# Patient Record
Sex: Male | Born: 1975 | Race: White | Hispanic: No | Marital: Single | Smoking: Former smoker
Health system: Southern US, Community
[De-identification: ages and names within clinical notes are randomized; demographics above are authoritative.]

## PROBLEM LIST (undated history)

## (undated) DIAGNOSIS — M5416 Radiculopathy, lumbar region: Secondary | ICD-10-CM

## (undated) DIAGNOSIS — R011 Cardiac murmur, unspecified: Secondary | ICD-10-CM

## (undated) DIAGNOSIS — M5136 Other intervertebral disc degeneration, lumbar region: Secondary | ICD-10-CM

## (undated) HISTORY — PX: OTHER SURGICAL HISTORY: SHX169

## (undated) HISTORY — PX: TONSILECTOMY/ADENOIDECTOMY WITH MYRINGOTOMY: SHX6125

## (undated) HISTORY — DX: Radiculopathy, lumbar region: M54.16

## (undated) HISTORY — DX: Cardiac murmur, unspecified: R01.1

## (undated) HISTORY — DX: Other intervertebral disc degeneration, lumbar region: M51.36

---

## 2003-07-29 ENCOUNTER — Encounter: Admission: RE | Admit: 2003-07-29 | Discharge: 2003-07-29 | Payer: Self-pay | Admitting: Orthopedic Surgery

## 2004-04-20 ENCOUNTER — Ambulatory Visit: Payer: Self-pay | Admitting: Otolaryngology

## 2006-04-29 ENCOUNTER — Emergency Department: Payer: Self-pay | Admitting: Emergency Medicine

## 2010-04-24 ENCOUNTER — Encounter: Payer: Self-pay | Admitting: Orthopedic Surgery

## 2014-04-08 ENCOUNTER — Ambulatory Visit: Payer: Self-pay | Admitting: Podiatry

## 2015-01-12 ENCOUNTER — Other Ambulatory Visit: Payer: Self-pay | Admitting: Neurology

## 2015-01-12 DIAGNOSIS — M79605 Pain in left leg: Principal | ICD-10-CM

## 2015-01-12 DIAGNOSIS — M79604 Pain in right leg: Secondary | ICD-10-CM

## 2015-01-20 ENCOUNTER — Ambulatory Visit
Admission: RE | Admit: 2015-01-20 | Discharge: 2015-01-20 | Disposition: A | Discharge status: Home / Self Care | Payer: BC Managed Care – PPO | Source: Ambulatory Visit | Attending: Neurology | Admitting: Neurology

## 2015-01-20 ENCOUNTER — Other Ambulatory Visit: Payer: Self-pay | Admitting: Internal Medicine

## 2015-01-20 DIAGNOSIS — M5126 Other intervertebral disc displacement, lumbar region: Secondary | ICD-10-CM | POA: Diagnosis not present

## 2015-01-20 DIAGNOSIS — M79604 Pain in right leg: Secondary | ICD-10-CM

## 2015-01-20 DIAGNOSIS — R222 Localized swelling, mass and lump, trunk: Principal | ICD-10-CM

## 2015-01-20 DIAGNOSIS — M489 Spondylopathy, unspecified: Secondary | ICD-10-CM

## 2015-01-20 DIAGNOSIS — M79605 Pain in left leg: Secondary | ICD-10-CM

## 2015-01-22 ENCOUNTER — Ambulatory Visit: Payer: BC Managed Care – PPO

## 2015-01-30 ENCOUNTER — Ambulatory Visit
Admission: RE | Admit: 2015-01-30 | Discharge: 2015-01-30 | Disposition: A | Discharge status: Home / Self Care | Payer: BC Managed Care – PPO | Source: Ambulatory Visit | Attending: Internal Medicine | Admitting: Internal Medicine

## 2015-01-30 DIAGNOSIS — M5124 Other intervertebral disc displacement, thoracic region: Secondary | ICD-10-CM | POA: Insufficient documentation

## 2015-01-30 DIAGNOSIS — R222 Localized swelling, mass and lump, trunk: Secondary | ICD-10-CM | POA: Diagnosis present

## 2015-01-30 DIAGNOSIS — M489 Spondylopathy, unspecified: Secondary | ICD-10-CM

## 2015-01-30 MED ORDER — GADOBENATE DIMEGLUMINE 529 MG/ML IV SOLN: 20.0000 mL | Freq: Once | INTRAVENOUS | Status: DC | PRN

## 2015-07-15 ENCOUNTER — Encounter: Payer: Self-pay | Admitting: Pain Medicine

## 2015-07-15 ENCOUNTER — Ambulatory Visit: Payer: BC Managed Care – PPO | Attending: Pain Medicine | Admitting: Pain Medicine

## 2015-07-15 VITALS — BP 121/84 | HR 80 | Temp 97.3°F | Resp 16 | Ht 68.0 in | Wt 220.0 lb

## 2015-07-15 DIAGNOSIS — G5702 Lesion of sciatic nerve, left lower limb: Secondary | ICD-10-CM

## 2015-07-15 DIAGNOSIS — M533 Sacrococcygeal disorders, not elsewhere classified: Secondary | ICD-10-CM | POA: Diagnosis not present

## 2015-07-15 DIAGNOSIS — M79604 Pain in right leg: Secondary | ICD-10-CM | POA: Diagnosis present

## 2015-07-15 DIAGNOSIS — M5116 Intervertebral disc disorders with radiculopathy, lumbar region: Secondary | ICD-10-CM | POA: Diagnosis not present

## 2015-07-15 DIAGNOSIS — M47814 Spondylosis without myelopathy or radiculopathy, thoracic region: Secondary | ICD-10-CM

## 2015-07-15 DIAGNOSIS — M5126 Other intervertebral disc displacement, lumbar region: Secondary | ICD-10-CM | POA: Diagnosis not present

## 2015-07-15 DIAGNOSIS — M4806 Spinal stenosis, lumbar region: Secondary | ICD-10-CM | POA: Insufficient documentation

## 2015-07-15 DIAGNOSIS — M47894 Other spondylosis, thoracic region: Secondary | ICD-10-CM

## 2015-07-15 DIAGNOSIS — M5416 Radiculopathy, lumbar region: Secondary | ICD-10-CM

## 2015-07-15 DIAGNOSIS — M47816 Spondylosis without myelopathy or radiculopathy, lumbar region: Secondary | ICD-10-CM

## 2015-07-15 DIAGNOSIS — M51369 Other intervertebral disc degeneration, lumbar region without mention of lumbar back pain or lower extremity pain: Secondary | ICD-10-CM

## 2015-07-15 DIAGNOSIS — M5136 Other intervertebral disc degeneration, lumbar region: Secondary | ICD-10-CM

## 2015-07-15 DIAGNOSIS — G5701 Lesion of sciatic nerve, right lower limb: Secondary | ICD-10-CM

## 2015-07-15 DIAGNOSIS — M545 Low back pain: Secondary | ICD-10-CM | POA: Diagnosis present

## 2015-07-15 DIAGNOSIS — G5700 Lesion of sciatic nerve, unspecified lower limb: Secondary | ICD-10-CM | POA: Insufficient documentation

## 2015-07-15 DIAGNOSIS — M79605 Pain in left leg: Secondary | ICD-10-CM | POA: Diagnosis present

## 2015-07-15 HISTORY — DX: Other intervertebral disc degeneration, lumbar region: M51.36

## 2015-07-15 HISTORY — DX: Radiculopathy, lumbar region: M54.16

## 2015-07-15 HISTORY — DX: Other intervertebral disc degeneration, lumbar region without mention of lumbar back pain or lower extremity pain: M51.369

## 2015-07-15 NOTE — Progress Notes (Signed)
Subjective:    Patient ID: Marvin Le, male    DOB: May 17, 1975, 40 y.o.   MRN: [id number]  HPI  The patient is a 40 year old gentleman who comes to pain management at the request of Dr. Emily Filbert for further evaluation and treatment of pain involving the lower back and lower extremity regions predominantly. The patient has long history of pain since childhood with pain occurring in the feet the patient eventually underwent surgery of the lower extremities with surgery of the gastrocnemius muscles with listening and continues to have pain despite surgery. The patient has also undergone Botox injections of the lower extremities and has undergone intraspinal injection without significant relief of pain. The patient states that his pain is aching burning cramping distressing dreadful dull feeling of constriction nagging tender patient states that the pain is aggravated by sitting standing and decreases with medications we discussed patient's condition and informed patient that we may consider referring patient to a tertiary center such as [facility], [facility] or [facility] for further evaluation and treatment after evaluation of patient and discussion of patient's condition and will inform patient that we could consider injection in the gluteal and piriformis musculature regions since patient appeared to be with what may be component of piriformis syndrome. The patient also mentioned piriformis syndrome today as stated that he had been doing some research on the symptoms and felt that piriformis syndrome could be contributing to his symptoms we informed patient that we will proceed with interventional treatment time return appointment consisting of cluneal and sciatic nerve blocks in attempt to decrease severity of symptoms, minimize progression of symptoms, and avoid the need for more involved treatment. The patient was with understanding and in agreement with suggested  treatment plan. We did not promise that we would prescribe any medications for the patient for treatment of his condition at any time       Review of Systems    Cardiovascular: Unremarkable   Pulmonary: Unremarkable  Neurological: Unremarkable  Psychological: Unremarkable  Gastrointestinal: Unremarkable  Genitourinary: Unremarkable  Hematologic: Unremarkable  Endocrine: Unremarkable  Rheumatological: Unremarkable  Musculoskeletal: Unremarkable  Other significant: Unremarkable      Objective:   Physical Exam   There was tenderness to palpation of paraspinal muscular treat and cervical region cervical facet region of mild degree with mild tenderness over the splenius capitis and occipitalis musculature regions. There was mild tenderness over the thoracic facet thoracic paraspinal musculature region. No crepitus of the thoracic region was noted Palpation of the acromioclavicular and glenohumeral joint regions reproduces minimal discomfort. The patient was with bilaterally equal grip strength and Tinel and Phalen's maneuver were without increase of pain of significant degree. There was tenderness over the lumbar paraspinal lumbar facet region a mild degree with mild tenderness with lateral bending rotation extension and palpation of the lumbar facets. Palpation of the PSIS and PII S regions were without increased pain of significant degree. Straight leg raising was tolerates approximately 30 with questionable increase of pain with dorsiflexion noted. EHL strength was questionably decreased. No definite sensory deficit or dermatomal distribution detected. No excessive tends to palpation of the knees were noted No allodynia of the lower extremities noted there was minimal tenderness of the greater trochanteric region iliotibial band region. Patient with moderate difficulty attending to stand on tiptoes and heels there was negative Homans.     Assessment & Plan:    Piriformis syndrome  Degenerative disc disease lumbar spine  L1-2  minimal disc bulging without stenosis L2-3 minimal bulging and congenitally short pedicles resulting in minimal bilateral neural foraminal stenosis L3-4 mild disc bulging congenitally steroid pedicles mild facet hypertrophy resulting in mild to moderate left greater than right neuroforaminal stenosis No significant spinal stenosis L4-L5 mild disc bulging congenitally short pedicles and mild facet hypertrophy resulting in mild left lateral recess stenosis and mild right and mild to moderate left neuroforaminal stenosis With no significant spinal stenosis L5-S1 negative  Lumbar facet syndrome  Lumbar radiculopathy  Sacroiliac joint dysfunction      PLAN  Continue present medications  Cluneal and sciatic nerve blocks to be performed at time of return appointment  F/U PCP Dr. Loleta Chance for evaliation of  BP and general medical  condition  F/U surgical evaluation. May consider pending follow-up evaluations  F/U neurological evaluation. May consider pending follow-up evaluations  May consider radiofrequency rhizolysis or intraspinal procedures pending response to present treatment and F/U evaluation   Patient to call [facility] should patient have concerns prior to scheduled return appointment.

## 2015-07-15 NOTE — Patient Instructions (Addendum)
Continue present medications  Cluneal and sciatic nerve blocks to be performed at time of return appointment  F/U PCP Dr. Loleta Chance for evaliation of  BP and general medical  condition  F/U surgical evaluation. May consider pending follow-up evaluations  F/U neurological evaluation. May consider pending follow-up evaluations  May consider radiofrequency rhizolysis or intraspinal procedures pending response to present treatment and F/U evaluation   Patient to call [facility] should patient have concerns prior to scheduled return appointment. Trigger Point Injection Trigger points are areas where you have muscle pain. A trigger point injection is a shot given in the trigger point to relieve that pain. A trigger point might feel like a knot in your muscle. It hurts to press on a trigger point. Sometimes the pain spreads out (radiates) to other parts of the body. For example, pressing on a trigger point in your shoulder might cause pain in your arm or neck. You might have one trigger point. Or, you might have more than one. People often have trigger points in their upper back and lower back. They also occur often in the neck and shoulders. Pain from a trigger point lasts for a long time. It can make it hard to keep moving. You might not be able to do the exercise or physical therapy that could help you deal with the pain. A trigger point injection may help. It does not work for everyone. But, it may relieve your pain for a few days or a few months. A trigger point injection does not cure long-lasting (chronic) pain. LET YOUR CAREGIVER KNOW ABOUT:  Any allergies (especially to latex, lidocaine, or steroids).  Blood-thinning medicines that you take. These drugs can lead to bleeding or bruising after an injection. They include:  Aspirin.  Ibuprofen.  Clopidogrel.  Warfarin.  Other medicines you take. This includes all vitamins, herbs, eyedrops, over-the-counter medicines, and  creams.  Use of steroids.  Recent infections.  Past problems with numbing medicines.  Bleeding problems.  Surgeries you have had.  Other health problems. RISKS AND COMPLICATIONS A trigger point injection is a safe treatment. However, problems may develop, such as:  Minor side effects usually go away in 1 to 2 days. These may include:  Soreness.  Bruising.  Stiffness.  More serious problems are rare. But, they may include:  Bleeding under the skin (hematoma).  Skin infection.  Breaking off of the needle under your skin.  Lung puncture.  The trigger point injection may not work for you. BEFORE THE PROCEDURE You may need to stop taking any medicine that thins your blood. This is to prevent bleeding and bruising. Usually these medicines are stopped several days before the injection. No other preparation is needed. PROCEDURE  A trigger point injection can be given in your caregiver's office or in a clinic. Each injection takes 2 minutes or less.  Your caregiver will feel for trigger points. The caregiver may use a marker to circle the area for the injection.  The skin over the trigger point will be washed with a germ-killing (antiseptic) solution.  The caregiver pinches the spot for the injection.  Then, a very thin needle is used for the shot. You may feel pain or a twitching feeling when the needle enters the trigger point.  A numbing solution may be injected into the trigger point. Sometimes a drug to keep down swelling, redness, and warmth (inflammation) is also injected.  Your caregiver moves the needle around the trigger zone until the tightness  and twitching goes away.  After the injection, your caregiver may put gentle pressure over the injection site.  Then it is covered with a bandage. AFTER THE PROCEDURE  You can go right home after the injection.  The bandage can be taken off after a few hours.  You may feel sore and stiff for 1 to 2 days.  Go  back to your regular activities slowly. Your caregiver may ask you to stretch your muscles. Do not do anything that takes extra energy for a few days.  Follow your caregiver's instructions to manage and treat other pain.   This information is not intended to replace advice given to you by your health care provider. Make sure you discuss any questions you have with your health care provider.   Document Released: 03/09/2011 Document Revised: 07/15/2012 Document Reviewed: 03/09/2011 Elsevier Interactive Patient Education [employer].

## 2015-07-15 NOTE — Progress Notes (Signed)
Safety precautions to be maintained throughout the outpatient stay will include: orient to surroundings, keep bed in low position, maintain call bell within reach at all times, provide assistance with transfer out of bed and ambulation.  

## 2015-07-21 LAB — TOXASSURE SELECT 13 (MW), URINE: PDF: 0

## 2015-08-11 ENCOUNTER — Encounter: Payer: Self-pay | Admitting: Pain Medicine

## 2015-08-11 ENCOUNTER — Ambulatory Visit: Payer: BC Managed Care – PPO | Attending: Pain Medicine | Admitting: Pain Medicine

## 2015-08-11 VITALS — BP 106/67 | HR 65 | Temp 98.7°F | Resp 160 | Ht 69.0 in | Wt 220.0 lb

## 2015-08-11 DIAGNOSIS — M5126 Other intervertebral disc displacement, lumbar region: Secondary | ICD-10-CM | POA: Insufficient documentation

## 2015-08-11 DIAGNOSIS — M791 Myalgia: Secondary | ICD-10-CM | POA: Diagnosis present

## 2015-08-11 DIAGNOSIS — M47894 Other spondylosis, thoracic region: Secondary | ICD-10-CM

## 2015-08-11 DIAGNOSIS — M545 Low back pain: Secondary | ICD-10-CM | POA: Diagnosis present

## 2015-08-11 DIAGNOSIS — G5701 Lesion of sciatic nerve, right lower limb: Secondary | ICD-10-CM

## 2015-08-11 DIAGNOSIS — M5416 Radiculopathy, lumbar region: Secondary | ICD-10-CM

## 2015-08-11 DIAGNOSIS — M4806 Spinal stenosis, lumbar region: Secondary | ICD-10-CM | POA: Insufficient documentation

## 2015-08-11 DIAGNOSIS — M5136 Other intervertebral disc degeneration, lumbar region: Secondary | ICD-10-CM | POA: Insufficient documentation

## 2015-08-11 DIAGNOSIS — G5702 Lesion of sciatic nerve, left lower limb: Secondary | ICD-10-CM

## 2015-08-11 DIAGNOSIS — M533 Sacrococcygeal disorders, not elsewhere classified: Secondary | ICD-10-CM

## 2015-08-11 DIAGNOSIS — M79606 Pain in leg, unspecified: Secondary | ICD-10-CM | POA: Diagnosis present

## 2015-08-11 DIAGNOSIS — M47816 Spondylosis without myelopathy or radiculopathy, lumbar region: Secondary | ICD-10-CM

## 2015-08-11 DIAGNOSIS — M47814 Spondylosis without myelopathy or radiculopathy, thoracic region: Secondary | ICD-10-CM

## 2015-08-11 MED ORDER — BUPIVACAINE HCL (PF) 0.25 % IJ SOLN: 30.0000 mL | Freq: Once | INTRAMUSCULAR | Status: AC

## 2015-08-11 MED ORDER — ORPHENADRINE CITRATE 30 MG/ML IJ SOLN
INTRAMUSCULAR | Status: AC
  Filled 2015-08-11: qty 2

## 2015-08-11 MED ORDER — TRIAMCINOLONE ACETONIDE 40 MG/ML IJ SUSP
INTRAMUSCULAR | Status: AC
  Filled 2015-08-11: qty 1

## 2015-08-11 MED ORDER — BUPIVACAINE HCL (PF) 0.25 % IJ SOLN
INTRAMUSCULAR | Status: AC
  Filled 2015-08-11: qty 30

## 2015-08-11 MED ORDER — ORPHENADRINE CITRATE 30 MG/ML IJ SOLN: 60.0000 mg | Freq: Once | INTRAMUSCULAR | Status: AC

## 2015-08-11 MED ORDER — TRIAMCINOLONE ACETONIDE 40 MG/ML IJ SUSP: 40.0000 mg | Freq: Once | INTRAMUSCULAR | Status: AC

## 2015-08-11 NOTE — Progress Notes (Signed)
Subjective:    Patient ID: Marvin Le, male    DOB: 09/20/1975, 40 y.o.   MRN: [id number]  HPI  PROCEDURE PERFORMED: Cluneal sciatic nerve block   NOTE: The patient is a 40 y.o. male who [facility] to [facility] for further evaluation and treatment of pain involving the lumbar and lower extremity region with pain occurring in the region of the buttocks and lower extremity of significant degree. Prior studies revealed the patient to be with MRI findings of degenerative disc disease lumbar spine  L1-2 minimal disc bulging without stenosis L2-3 minimal bulging and congenitally short pedicles resulting in minimal bilateral neural foraminal stenosis L3-4 mild disc bulging congenitally steroid pedicles mild facet hypertrophy resulting in mild to moderate left greater than right neuroforaminal stenosis No significant spinal stenosis L4-L5 mild disc bulging congenitally short pedicles and mild facet hypertrophy resulting in mild left lateral recess stenosis and mild right and mild to moderate left neuroforaminal stenosis With no significant spinal stenosis area the patient is with reproduction of symptoms with palpation in the region of the gluteal musculature region and piriformis musculature region. L5-S1 was without significant abnormalities There is concern regarding component of pain due to piriformis syndrome. We have also discussed patient's pain originating from intraspinal abnormalities as well as entrapment due to muscle as well as other condition. The risks, benefits, and expectations of the procedure have been discussed and explained to the patient who was understanding and in agreement with suggested treatment plan. We will proceed with interventional treatment as discussed and as explained to the patient who wishes to proceed with proposed treatment.   PROCEDURE #1: Left cluneal nerve block with EKG, blood pressure, pulse, and pulse oximetry monitoring. The procedure was  performed with the patient in the lateral decubitus position. Following alcohol prep of proposed entry site and identification of landmarks, a 22 -gauge needle was inserted into the gluteal musculature region and following elicitation of paresthesias radiating to the buttocks, needle was slightly withdrawn and following negative aspiration, a total of 4 mL of 0.25% bupivacaine with Kenalog injected for left  cluneal nerve block. Needle removed. The patient tolerated the injection well.   PROCEDURE #2: Left sciatic nerve block with EKG, blood pressure, pulse, and pulse oximetry monitoring. The procedure was performed with the patient in the lateral decubitus position. Following identification of greater trochanter for establishing point of needle entry and alcohol prep of proposed entry site, a 22 -gauge needle was inserted and following elicitation of paresthesias radiating from buttocks to the foot, needle was slightly withdrawn. Following negative aspiration, a total of 4 mL of 0.25% bupivacaine with Kenalog injected for left sciatic nerve block. Needle removed. The patient tolerated injection well. A total of 10 mg of Kenalog was utilized for the procedure.   Myoneural block injections of the gluteal musculature region Following alcohol prep of proposed entry site a 22-gauge needle was inserted in the gluteal musculature region and following negative aspiration 2 cc of 0.25% bupivacaine with Norflex was injected for myoneural block injection of the gluteal musculature region  The patient tolerated procedure well  A total of 10 mg of Kenalog was utilized for the procedure  PLAN:   1. Medications: Will continue presently prescribed medications. 2. Will consider modification of treatment regimen pending response to treatment rendered on today's visit and follow-up evaluation. 3. The patient is to follow-up with primary care physician Dr. Emily Filbert regarding blood pressure and general medical  condition status pose procedure performed  on today's visit. 4. Surgical evaluation as discussed. We may consider pending follow-up evaluation 5. Neurological evaluation as discussed. We may consider additional studies for neurological assessment of patient's condition 6. The patient may be a candidate for radiofrequency procedures, Botox injections, implantation type procedures, and other treatment pending response to treatment rendered on today's visit and follow-up evaluation. 7. The patient has been advised to adhere to proper body mechanics and avoid activities which appear to aggravate condition. 8. The patient has been advised to call the [facility] prior to scheduled return appointment should there be significant change in condition or should patient have other concerns regarding condition prior to scheduled return appointment.  The patient is understanding and in agreement with suggested treatment plan.    Review of Systems     Objective:   Physical Exam        Assessment & Plan:

## 2015-08-11 NOTE — Progress Notes (Signed)
Patient here for procedure d/t bilateral buttocks pain and bilateral foot pain.

## 2015-08-11 NOTE — Patient Instructions (Addendum)
Continue present medication  F/U PCP Dr. Loleta Chance for evaliation of  BP and general medical  condition  F/U surgical evaluation. May consider pending follow-up evaluations  F/U neurological evaluation. May consider pending follow-up evaluations  May consider radiofrequency rhizolysis or intraspinal procedures pending response to present treatment and F/U evaluation   Patient to call [facility] should patient have concerns prior to scheduled return appointment. Sciatica Sciatica is pain, weakness, numbness, or tingling along the path of the sciatic nerve. The nerve starts in the lower back and runs down the back of each leg. The nerve controls the muscles in the lower leg and in the back of the knee, while also providing sensation to the back of the thigh, lower leg, and the sole of your foot. Sciatica is a symptom of another medical condition. For instance, nerve damage or certain conditions, such as a herniated disk or bone spur on the spine, pinch or put pressure on the sciatic nerve. This causes the pain, weakness, or other sensations normally associated with sciatica. Generally, sciatica only affects one side of the body. CAUSES   Herniated or slipped disc.  Degenerative disk disease.  A pain disorder involving the narrow muscle in the buttocks (piriformis syndrome).  Pelvic injury or fracture.  Pregnancy.  Tumor (rare). SYMPTOMS  Symptoms can vary from mild to very severe. The symptoms usually travel from the low back to the buttocks and down the back of the leg. Symptoms can include:  Mild tingling or dull aches in the lower back, leg, or hip.  Numbness in the back of the calf or sole of the foot.  Burning sensations in the lower back, leg, or hip.  Sharp pains in the lower back, leg, or hip.  Leg weakness.  Severe back pain inhibiting movement. These symptoms may get worse with coughing, sneezing, laughing, or prolonged sitting or standing. Also, being  overweight may worsen symptoms. DIAGNOSIS  Your caregiver will perform a physical exam to look for common symptoms of sciatica. He or she may ask you to do certain movements or activities that would trigger sciatic nerve pain. Other tests may be performed to find the cause of the sciatica. These may include:  Blood tests.  X-rays.  Imaging tests, such as an MRI or CT scan. TREATMENT  Treatment is directed at the cause of the sciatic pain. Sometimes, treatment is not necessary and the pain and discomfort goes away on its own. If treatment is needed, your caregiver may suggest:  Over-the-counter medicines to relieve pain.  Prescription medicines, such as anti-inflammatory medicine, muscle relaxants, or narcotics.  Applying heat or ice to the painful area.  Steroid injections to lessen pain, irritation, and inflammation around the nerve.  Reducing activity during periods of pain.  Exercising and stretching to strengthen your abdomen and improve flexibility of your spine. Your caregiver may suggest losing weight if the extra weight makes the back pain worse.  Physical therapy.  Surgery to eliminate what is pressing or pinching the nerve, such as a bone spur or part of a herniated disk. HOME CARE INSTRUCTIONS   Only take over-the-counter or prescription medicines for pain or discomfort as directed by your caregiver.  Apply ice to the affected area for 20 minutes, 3-4 times a day for the first 48-72 hours. Then try heat in the same way.  Exercise, stretch, or perform your usual activities if these do not aggravate your pain.  Attend physical therapy sessions as directed by your caregiver.  Keep all follow-up appointments as directed by your caregiver.  Do not wear high heels or shoes that do not provide proper support.  Check your mattress to see if it is too soft. A firm mattress may lessen your pain and discomfort. SEEK IMMEDIATE MEDICAL CARE IF:   You lose control of your  bowel or bladder (incontinence).  You have increasing weakness in the lower back, pelvis, buttocks, or legs.  You have redness or swelling of your back.  You have a burning sensation when you urinate.  You have pain that gets worse when you lie down or awakens you at night.  Your pain is worse than you have experienced in the past.  Your pain is lasting longer than 4 weeks.  You are suddenly losing weight without reason. MAKE SURE YOU:  Understand these instructions.  Will watch your condition.  Will get help right away if you are not doing well or get worse.   This information is not intended to replace advice given to you by your health care provider. Make sure you discuss any questions you have with your health care provider.   Document Released: 03/14/2001 Document Revised: 12/09/2014 Document Reviewed: 07/30/2011 Elsevier Interactive Patient Education 2016 [employer] Pain Management Discharge Instructions  General Discharge Instructions :  If you need to reach your doctor call: Monday-Friday 8:00 am - 4:00 pm at [phone] or toll free [phone].  After clinic hours [phone] to have operator reach doctor.  Bring all of your medication bottles to all your appointments in the pain clinic.  To cancel or reschedule your appointment with Pain Management please remember to call 24 hours in advance to avoid a fee.  Refer to the educational materials which you have been given on: General Risks, I had my Procedure. Discharge Instructions, Post Sedation.  Post Procedure Instructions:  The drugs you were given will stay in your system until tomorrow, so for the next 24 hours you should not drive, make any legal decisions or drink any alcoholic beverages.  You may eat anything you prefer, but it is better to start with liquids then soups and crackers, and gradually work up to solid foods.  Please notify your doctor immediately if you have any unusual bleeding,  trouble breathing or pain that is not related to your normal pain.  Depending on the type of procedure that was done, some parts of your body may feel week and/or numb.  This usually clears up by tonight or the next day.  Walk with the use of an assistive device or accompanied by an adult for the 24 hours.  You may use ice on the affected area for the first 24 hours.  Put ice in a Ziploc bag and cover with a towel and place against area 15 minutes on 15 minutes off.  You may switch to heat after 24 hours.Trigger Point Injection Trigger points are areas where you have muscle pain. A trigger point injection is a shot given in the trigger point to relieve that pain. A trigger point might feel like a knot in your muscle. It hurts to press on a trigger point. Sometimes the pain spreads out (radiates) to other parts of the body. For example, pressing on a trigger point in your shoulder might cause pain in your arm or neck. You might have one trigger point. Or, you might have more than one. People often have trigger points in their upper back and lower back. They also occur often in the neck  and shoulders. Pain from a trigger point lasts for a long time. It can make it hard to keep moving. You might not be able to do the exercise or physical therapy that could help you deal with the pain. A trigger point injection may help. It does not work for everyone. But, it may relieve your pain for a few days or a few months. A trigger point injection does not cure long-lasting (chronic) pain. LET YOUR CAREGIVER KNOW ABOUT:  Any allergies (especially to latex, lidocaine, or steroids).  Blood-thinning medicines that you take. These drugs can lead to bleeding or bruising after an injection. They include:  Aspirin.  Ibuprofen.  Clopidogrel.  Warfarin.  Other medicines you take. This includes all vitamins, herbs, eyedrops, over-the-counter medicines, and creams.  Use of steroids.  Recent infections.  Past  problems with numbing medicines.  Bleeding problems.  Surgeries you have had.  Other health problems. RISKS AND COMPLICATIONS A trigger point injection is a safe treatment. However, problems may develop, such as:  Minor side effects usually go away in 1 to 2 days. These may include:  Soreness.  Bruising.  Stiffness.  More serious problems are rare. But, they may include:  Bleeding under the skin (hematoma).  Skin infection.  Breaking off of the needle under your skin.  Lung puncture.  The trigger point injection may not work for you. BEFORE THE PROCEDURE You may need to stop taking any medicine that thins your blood. This is to prevent bleeding and bruising. Usually these medicines are stopped several days before the injection. No other preparation is needed. PROCEDURE  A trigger point injection can be given in your caregiver's office or in a clinic. Each injection takes 2 minutes or less.  Your caregiver will feel for trigger points. The caregiver may use a marker to circle the area for the injection.  The skin over the trigger point will be washed with a germ-killing (antiseptic) solution.  The caregiver pinches the spot for the injection.  Then, a very thin needle is used for the shot. You may feel pain or a twitching feeling when the needle enters the trigger point.  A numbing solution may be injected into the trigger point. Sometimes a drug to keep down swelling, redness, and warmth (inflammation) is also injected.  Your caregiver moves the needle around the trigger zone until the tightness and twitching goes away.  After the injection, your caregiver may put gentle pressure over the injection site.  Then it is covered with a bandage. AFTER THE PROCEDURE  You can go right home after the injection.  The bandage can be taken off after a few hours.  You may feel sore and stiff for 1 to 2 days.  Go back to your regular activities slowly. Your caregiver may  ask you to stretch your muscles. Do not do anything that takes extra energy for a few days.  Follow your caregiver's instructions to manage and treat other pain.   This information is not intended to replace advice given to you by your health care provider. Make sure you discuss any questions you have with your health care provider.   Document Released: 03/09/2011 Document Revised: 07/15/2012 Document Reviewed: 03/09/2011 Elsevier Interactive Patient Education [employer].

## 2015-08-31 ENCOUNTER — Ambulatory Visit: Payer: BC Managed Care – PPO | Attending: Pain Medicine | Admitting: Pain Medicine

## 2015-08-31 ENCOUNTER — Encounter: Payer: Self-pay | Admitting: Pain Medicine

## 2015-08-31 VITALS — BP 113/79 | HR 75 | Temp 98.4°F | Resp 18 | Ht 69.0 in | Wt 220.0 lb

## 2015-08-31 DIAGNOSIS — M5126 Other intervertebral disc displacement, lumbar region: Secondary | ICD-10-CM | POA: Insufficient documentation

## 2015-08-31 DIAGNOSIS — M47816 Spondylosis without myelopathy or radiculopathy, lumbar region: Secondary | ICD-10-CM

## 2015-08-31 DIAGNOSIS — M79605 Pain in left leg: Secondary | ICD-10-CM | POA: Diagnosis present

## 2015-08-31 DIAGNOSIS — M4806 Spinal stenosis, lumbar region: Secondary | ICD-10-CM | POA: Insufficient documentation

## 2015-08-31 DIAGNOSIS — M47814 Spondylosis without myelopathy or radiculopathy, thoracic region: Secondary | ICD-10-CM

## 2015-08-31 DIAGNOSIS — M533 Sacrococcygeal disorders, not elsewhere classified: Secondary | ICD-10-CM | POA: Insufficient documentation

## 2015-08-31 DIAGNOSIS — M5116 Intervertebral disc disorders with radiculopathy, lumbar region: Secondary | ICD-10-CM | POA: Diagnosis not present

## 2015-08-31 DIAGNOSIS — G5702 Lesion of sciatic nerve, left lower limb: Secondary | ICD-10-CM

## 2015-08-31 DIAGNOSIS — M79604 Pain in right leg: Secondary | ICD-10-CM | POA: Diagnosis present

## 2015-08-31 DIAGNOSIS — M5416 Radiculopathy, lumbar region: Secondary | ICD-10-CM

## 2015-08-31 DIAGNOSIS — M5136 Other intervertebral disc degeneration, lumbar region: Secondary | ICD-10-CM

## 2015-08-31 DIAGNOSIS — M545 Low back pain: Secondary | ICD-10-CM | POA: Diagnosis present

## 2015-08-31 DIAGNOSIS — M47894 Other spondylosis, thoracic region: Secondary | ICD-10-CM

## 2015-08-31 DIAGNOSIS — G5701 Lesion of sciatic nerve, right lower limb: Secondary | ICD-10-CM

## 2015-08-31 NOTE — Progress Notes (Signed)
Safety precautions to be maintained throughout the outpatient stay will include: orient to surroundings, keep bed in low position, maintain call bell within reach at all times, provide assistance with transfer out of bed and ambulation.  

## 2015-08-31 NOTE — Patient Instructions (Addendum)
PLAN   Continue present medications Flexeril and tramadol  Lumbosacral selective nerve root block to be performed at time of return appointment  F/U PCP Dr. Loleta Chance for evaliation of  BP and general medical  condition  F/U surgical evaluation. May consider pending follow-up evaluations  F/U neurological evaluation. May consider additional studies pending follow-up evaluations  Ask the nurses and secretary the date of your appointment at [facility]  May consider radiofrequency rhizolysis or intraspinal procedures pending response to present treatment and F/U evaluation   Patient to call Pain Management Center should patient have concerns prior to scheduled return appointment. Selective Nerve Root Block Patient Information  Description: Specific nerve roots exit the spinal canal and these nerves can be compressed and inflamed by a bulging disc and bone spurs.  By injecting steroids on the nerve root, we can potentially decrease the inflammation surrounding these nerves, which often leads to decreased pain.  Also, by injecting local anesthesia on the nerve root, this can provide [place] helpful information to give to your referring doctor if it decreases your pain.  Selective nerve root blocks can be done along the spine from the neck to the low back depending on the location of your pain.   After numbing the skin with local anesthesia, a small needle is passed to the nerve root and the position of the needle is verified using x-ray pictures.  After the needle is in correct position, we then deposit the medication.  You may experience a pressure sensation while this is being done.  The entire block usually lasts less than 15 minutes.  Conditions that may be treated with selective nerve root blocks:  Low back and leg pain  Spinal stenosis  Diagnostic block prior to potential surgery  Neck and arm pain  Post laminectomy syndrome  Preparation for the injection:  1. Do not eat  any solid food or dairy products within 8 hours of your appointment. 2. You may drink clear liquids up to 3 hours before an appointment.  Clear liquids include water, black coffee, juice or soda.  No milk or cream please. 3. You may take your regular medications, including pain medications, with a sip of water before your appointment.  Diabetics should hold regular insulin (if taken separately) and take 1/2 normal NPH dose the morning of the procedure.  Carry some sugar containing items with you to your appointment. 4. A driver must accompany you and be prepared to drive you home after your procedure. 5. Bring all your current medications with you. 6. An IV may be inserted and sedation may be given at the discretion of the physician. 7. A blood pressure cuff, EKG, and other monitors will often be applied during the procedure.  Some patients may need to have extra oxygen administered for a short period. 8. You will be asked to provide medical information, including allergies, prior to the procedure.  We must know immediately if you are taking blood  Thinners (like Coumadin) or if you are allergic to IV iodine contrast (dye).  Possible side-effects: All are usually temporary  Bleeding from needle site  Light headedness  Numbness and tingling  Decreased blood pressure  Weakness in arms/legs  Pressure sensation in back/neck  Pain at injection site (several days)  Possible complications: All are extremely rare  Infection  Nerve injury  Spinal headache (a headache wore with upright position)  Call if you experience:  Fever/chills associated with headache or increased back/neck pain  Headache worsened  by an upright position  New onset weakness or numbness of an extremity below the injection site  Hives or difficulty breathing (go to the emergency room)  Inflammation or drainage at the injection site(s)  Severe back/neck pain greater than usual  New symptoms which are  concerning to you  Please note:  Although the local anesthetic injected can often make your back or neck feel good for several hours after the injection the pain will likely return.  It takes 3-5 days for steroids to work on the nerve root. You may not notice any pain relief for at least one week.  If effective, we will often do a series of 3 injections spaced 3-6 weeks apart to maximally decrease your pain.    If you have any questions, please call [phone] [facility]

## 2015-08-31 NOTE — Progress Notes (Signed)
Subjective:    Patient ID: Marvin Le, male    DOB: December 12, 1975, 40 y.o.   MRN: [id number]  HPI  The patient is a 40 year old gentleman who returns to pain management for further evaluation and treatment of pain involving the lower back and lower extremity regions. The patient has significant lower back and lower extremity pain and was with minimal relief of pain following cluneal and sciatic nerve blocks. We have been concern regarding piriformis syndrome contributing to patient's symptomatology. On today's visit we discussed patient's condition and will consider patient for lumbosacral selective nerve root block to be performed at time return appointment. The patient is status post Botox injections as well as prior spinal injections and is undergone surgery of the gastrocnemius muscles and tendons for treatment of pain involving the lower back lower extremity region. We discussed patient's condition on today's visit and patient will continue present medications consisting of tramadol and Flexeril. We will proceed with lumbosacral selective nerve root block at time of return appointment in attempt to decrease severity of symptoms, minimize progression of symptoms, and avoid the need for more involved treatment. We will also refer patient to [facility] for further evaluation and assessment of his condition as discussed. All agreed to suggested treatment plan     Review of Systems     Objective:   Physical Exam  There was tenderness to palpation of the paraspinal musculatures and the cervical region cervical facet region a mild degree with mild tenderness over the splenius capitis and occipitalis musculature regions. Palpation of the acromioclavicular and glenohumeral joint regions reproduce mild discomfort and patient appeared to be with unremarkable Spurling's maneuver. Tinel and Phalen's maneuver were without increased pain of significant degree and patient appeared to be with  bilaterally equal grip strength. Palpation over the thoracic region thoracic facet region was without reproduction of significant pain and no crepitus of the thoracic region was noted. Palpation over the lumbar paraspinal muscular treat and lumbar facet region was attends to palpation of moderate degree with moderate to moderately severe tenderness of the gluteal and piriformis musculature regions. Straight leg raising was limited to approximately 30 without a definite increased pain with dorsiflexion noted. Reevaluation revealed patient to be with slightly increased pain with dorsiflexion noted. EHL strength appeared to be slightly decreased. No definite sensory deficit or dermatomal dystrophy detected. There was negative clonus negative Homans. DTRs were difficult to elicit palpation over the PSIS and PII S region was associated with mild to moderate discomfort. There was mild to moderate tenderness along the greater trochanteric region iliotibial band region. Abdomen nontender with no costovertebral tenderness noted      Assessment & Plan:     Piriformis syndrome  Degenerative disc disease lumbar spine  L1-2 minimal disc bulging without stenosis L2-3 minimal bulging and congenitally short pedicles resulting in minimal bilateral neural foraminal stenosis L3-4 mild disc bulging congenitally steroid pedicles mild facet hypertrophy resulting in mild to moderate left greater than right neuroforaminal stenosis No significant spinal stenosis L4-L5 mild disc bulging congenitally short pedicles and mild facet hypertrophy resulting in mild left lateral recess stenosis and mild right and mild to moderate left neuroforaminal stenosis With no significant spinal stenosis L5-S1 negative  Lumbar facet syndrome  Lumbar radiculopathy  Sacroiliac joint dysfunction     PLAN   Continue present medications Flexeril and tramadol  Lumbosacral selective nerve root block to be performed at time of return  appointment  F/U PCP Dr. Loleta Chance for evaliation  of  BP and general medical  condition  F/U surgical evaluation. May consider pending follow-up evaluations  F/U neurological evaluation. May consider additional studies pending follow-up evaluations  Ask the nurses and secretary the date of your appointment at [facility]  May consider radiofrequency rhizolysis or intraspinal procedures pending response to present treatment and F/U evaluation   Patient to call Pain Management Center should patient have concerns prior to scheduled return appointment.

## 2015-09-13 ENCOUNTER — Ambulatory Visit: Payer: BC Managed Care – PPO | Attending: Pain Medicine | Admitting: Pain Medicine

## 2015-09-13 ENCOUNTER — Encounter: Payer: Self-pay | Admitting: Pain Medicine

## 2015-09-13 VITALS — BP 105/69 | HR 64 | Temp 97.4°F | Resp 14 | Ht 69.0 in | Wt 220.0 lb

## 2015-09-13 DIAGNOSIS — M47894 Other spondylosis, thoracic region: Secondary | ICD-10-CM

## 2015-09-13 DIAGNOSIS — M545 Low back pain: Secondary | ICD-10-CM | POA: Diagnosis present

## 2015-09-13 DIAGNOSIS — M533 Sacrococcygeal disorders, not elsewhere classified: Secondary | ICD-10-CM

## 2015-09-13 DIAGNOSIS — M47814 Spondylosis without myelopathy or radiculopathy, thoracic region: Secondary | ICD-10-CM

## 2015-09-13 DIAGNOSIS — M4806 Spinal stenosis, lumbar region: Secondary | ICD-10-CM | POA: Insufficient documentation

## 2015-09-13 DIAGNOSIS — M5416 Radiculopathy, lumbar region: Secondary | ICD-10-CM

## 2015-09-13 DIAGNOSIS — M79606 Pain in leg, unspecified: Secondary | ICD-10-CM | POA: Diagnosis present

## 2015-09-13 DIAGNOSIS — M5136 Other intervertebral disc degeneration, lumbar region: Secondary | ICD-10-CM | POA: Diagnosis not present

## 2015-09-13 DIAGNOSIS — G5702 Lesion of sciatic nerve, left lower limb: Secondary | ICD-10-CM

## 2015-09-13 DIAGNOSIS — G5701 Lesion of sciatic nerve, right lower limb: Secondary | ICD-10-CM

## 2015-09-13 DIAGNOSIS — M5126 Other intervertebral disc displacement, lumbar region: Secondary | ICD-10-CM | POA: Insufficient documentation

## 2015-09-13 DIAGNOSIS — M47816 Spondylosis without myelopathy or radiculopathy, lumbar region: Secondary | ICD-10-CM

## 2015-09-13 MED ORDER — CEFUROXIME AXETIL 250 MG PO TABS: 250.0000 mg | ORAL_TABLET | Freq: Two times a day (BID) | ORAL | Status: DC

## 2015-09-13 MED ORDER — FENTANYL CITRATE (PF) 100 MCG/2ML IJ SOLN
100.0000 ug | Freq: Once | INTRAMUSCULAR | Status: AC
  Administered 2015-09-13: 100 ug via INTRAVENOUS
  Filled 2015-09-13: qty 2

## 2015-09-13 MED ORDER — TRIAMCINOLONE ACETONIDE 40 MG/ML IJ SUSP
40.0000 mg | Freq: Once | INTRAMUSCULAR | Status: AC
  Administered 2015-09-13: 40 mg
  Filled 2015-09-13: qty 1

## 2015-09-13 MED ORDER — LIDOCAINE HCL (PF) 1 % IJ SOLN
10.0000 mL | Freq: Once | INTRAMUSCULAR | Status: AC
  Administered 2015-09-13: 10 mL via SUBCUTANEOUS
  Filled 2015-09-13: qty 10

## 2015-09-13 MED ORDER — LACTATED RINGERS IV SOLN: 1000.0000 mL | INTRAVENOUS | Status: AC

## 2015-09-13 MED ORDER — SODIUM CHLORIDE 0.9% FLUSH
20.0000 mL | Freq: Once | INTRAVENOUS | Status: AC
  Administered 2015-09-13: 20 mL

## 2015-09-13 MED ORDER — CEFAZOLIN SODIUM 1-5 GM-% IV SOLN
1.0000 g | Freq: Once | INTRAVENOUS | Status: AC
  Administered 2015-09-13: 1 g via INTRAVENOUS

## 2015-09-13 MED ORDER — ORPHENADRINE CITRATE 30 MG/ML IJ SOLN
60.0000 mg | Freq: Once | INTRAMUSCULAR | Status: AC
  Administered 2015-09-13: 60 mg via INTRAMUSCULAR
  Filled 2015-09-13: qty 2

## 2015-09-13 MED ORDER — CEFAZOLIN SODIUM 1 G IJ SOLR
INTRAMUSCULAR | Status: AC
  Administered 2015-09-13: 13:00:00
  Filled 2015-09-13: qty 10

## 2015-09-13 MED ORDER — BUPIVACAINE HCL (PF) 0.25 % IJ SOLN
30.0000 mL | Freq: Once | INTRAMUSCULAR | Status: AC
  Administered 2015-09-13: 30 mL
  Filled 2015-09-13: qty 30

## 2015-09-13 MED ORDER — MIDAZOLAM HCL 5 MG/5ML IJ SOLN
5.0000 mg | Freq: Once | INTRAMUSCULAR | Status: AC
  Administered 2015-09-13: 5 mg via INTRAVENOUS
  Filled 2015-09-13: qty 5

## 2015-09-13 NOTE — Progress Notes (Signed)
Safety precautions to be maintained throughout the outpatient stay will include: orient to surroundings, keep bed in low position, maintain call bell within reach at all times, provide assistance with transfer out of bed and ambulation.  

## 2015-09-13 NOTE — Progress Notes (Signed)
Subjective:    Patient ID: Marvin Le, male    DOB: 1975-06-16, 40 y.o.   MRN: [id number]  HPI  PROCEDURE PERFORMED: Lumbosacral selective nerve root block   NOTE: The patient is a 40 y.o. male who returns to [place] for further evaluation and treatment of pain involving the lumbar and lower extremity region. Studies consisting of MRI has revealed the patient to be with evidence of degenerative disc disease lumbar spine  L1-2 minimal disc bulging without stenosis L2-3 minimal bulging and congenitally short pedicles resulting in minimal bilateral neural foraminal stenosis L3-4 mild disc bulging congenitally steroid pedicles mild facet hypertrophy resulting in mild to moderate left greater than right neuroforaminal stenosis No significant spinal stenosis L4-L5 mild disc bulging congenitally short pedicles and mild facet hypertrophy resulting in mild left lateral recess stenosis and mild right and mild to moderate left neuroforaminal stenosis With no significant spinal stenosis L5-S1 negative. There is concern regarding intraspinal abnormalities contributing to the patient's symptomatology with there being concern regarding component of patient's pain being due to lumbar radiculopathy. There had been concern regarding patient's pain being due to piriformis syndrome as well as other conditions. The risks, benefits, and expectations of the procedure have been explained to the patient who was understanding and in agreement with suggested treatment plan. We will proceed with interventional treatment as discussed and as explained to the patient. The patient is understanding and in agreement with suggested treatment plan.   DESCRIPTION OF PROCEDURE: Lumbosacral selective nerve root block with IV Versed, IV fentanyl conscious sedation, EKG, blood pressure, pulse, capnography, and pulse oximetry monitoring. The procedure was performed with the patient in the prone position under  fluoroscopic guidance. With the patient in the prone position, Betadine prep of proposed entry site was performed. Local anesthetic skin wheal of proposed needle entry site was prepared with 1.5% plain lidocaine with AP view of the lumbosacral spine.   PROCEDURE #1: Needle placement at the left L 3 vertebral body: A 22 -gauge needle was inserted at the inferior border of the transverse process of the vertebral body with needle placed medial to the midline of the transverse process on AP view of the lumbosacral spine.   NEEDLE PLACEMENT AT  L4 and L5  VERTEBRAL BODY LEVELS  Needle  placement was accomplished at L4 and L5  vertebral body levels on the left side exactly as was accomplished at the L3  vertebral body level  and utilizing the same technique and under fluoroscopic guidance.  PROCEDURE #4: Needle placement at the S1 foramen. With the patient in the prone position with Betadine prep of proposed entry site accomplished, the S1 foramen was visualized under fluoroscopic guidance with AP view of the lumbosacral spine with cephalad orientation of the fluoroscope with local anesthetic skin wheal of 1.5% lidocaine of proposed needle entry site prepared. A 22-gauge needle was inserted S1 foramen under fluoroscopic guidance eliciting paresthesias radiating from the buttocks to the lower extremity after which needle was slightly withdrawn.   Needle placement was then verified on lateral view at all levels with needle tip documented to be in the posterior superior quadrant of the intervertebral foramen of  L 3, L4, L5, and needle tip documented at the level of the S1 foramen. Following negative aspiration for heme and CSF at each level, each level was injected with 3 mL of 0.25% bupivacaine with Kenalog  Myoneural block injection of the gluteal musculature region Following Betadine prep of proposed entry site a  22-gauge needle was inserted into the gluteal musculature region and following negative  aspiration 2 cc of 0.25% bupivacaine with Norflex was injected for myoneural block injection of the gluteal musculature region times two.  The patient tolerated the procedure well.   A total of 10 mg of Kenalog was utilized for the procedure.   PLAN:  1. Medications: Will continue presently prescribed medications Flexeril and tramadol. 2. The patient is to undergo follow-up evaluation with PCP Dr. Loleta Chance  for evaluation of blood pressure and general medical condition status post procedure performed on today's visit. 3. Surgical follow-up evaluation. Has been addressed . 4. Neurological evaluation.. May consider additional studies and follow-up evaluation and further assessment of patient's condition  5. Evaluation at [facility] has been discussed  6. May consider radiofrequency procedures, implantation type procedures and other treatment pending response to treatment and follow-up evaluation. 7. The patient has been advise do adhere to proper body mechanics and avoid activities which may aggravate condition. 8. The patient has been advised to call the Pain Management Center prior to scheduled return appointment should there be significant change in the patient's condition or should the patient have other concerns regarding condition prior to scheduled return appointment.   Review of Systems     Objective:   Physical Exam        Assessment & Plan:

## 2015-09-13 NOTE — Patient Instructions (Addendum)
PLAN  Prescription for Ceftin (antibiotic) to be picked up at your pharmacy.  Please start Ceftin today and complete full course.  Continue present medications Flexeril and tramadol . Please obtain Ceftin antibiotic today and begin taking Ceftin antibiotic as prescribed  F/U PCP Dr. Loleta Chance for evaliation of  BP and general medical  condition  F/U surgical evaluation. May consider pending follow-up evaluations  F/U neurological evaluation. May consider additional studies pending follow-up evaluations  Ask the nurses and secretary the date of your appointment at [facility] as previously discussed  May consider radiofrequency rhizolysis or intraspinal procedures pending response to present treatment and F/U evaluation   Patient to call Pain Management Center should patient have concerns prior to scheduled return appointment.Pain Management Discharge Instructions  General Discharge Instructions :  If you need to reach your doctor call: Monday-Friday 8:00 am - 4:00 pm at [phone] or toll free [phone].  After clinic hours [phone] to have operator reach doctor.  Bring all of your medication bottles to all your appointments in the pain clinic.  To cancel or reschedule your appointment with Pain Management please remember to call 24 hours in advance to avoid a fee.  Refer to the educational materials which you have been given on: General Risks, I had my Procedure. Discharge Instructions, Post Sedation.  Post Procedure Instructions:  The drugs you were given will stay in your system until tomorrow, so for the next 24 hours you should not drive, make any legal decisions or drink any alcoholic beverages.  You may eat anything you prefer, but it is better to start with liquids then soups and crackers, and gradually work up to solid foods.  Please notify your doctor immediately if you have any unusual bleeding, trouble breathing or pain that is not related to your  normal pain.  Depending on the type of procedure that was done, some parts of your body may feel week and/or numb.  This usually clears up by tonight or the next day.  Walk with the use of an assistive device or accompanied by an adult for the 24 hours.  You may use ice on the affected area for the first 24 hours.  Put ice in a Ziploc bag and cover with a towel and place against area 15 minutes on 15 minutes off.  You may switch to heat after 24 hours.Pain Management Discharge Instructions  General Discharge Instructions :  If you need to reach your doctor call: Monday-Friday 8:00 am - 4:00 pm at [phone] or toll free [phone].  After clinic hours [phone] to have operator reach doctor.  Bring all of your medication bottles to all your appointments in the pain clinic.  To cancel or reschedule your appointment with Pain Management please remember to call 24 hours in advance to avoid a fee.  Refer to the educational materials which you have been given on: General Risks, I had my Procedure. Discharge Instructions, Post Sedation.  Post Procedure Instructions:  The drugs you were given will stay in your system until tomorrow, so for the next 24 hours you should not drive, make any legal decisions or drink any alcoholic beverages.  You may eat anything you prefer, but it is better to start with liquids then soups and crackers, and gradually work up to solid foods.  Please notify your doctor immediately if you have any unusual bleeding, trouble breathing or pain that is not related to your normal pain.  Depending on the type of procedure that  was done, some parts of your body may feel week and/or numb.  This usually clears up by tonight or the next day.  Walk with the use of an assistive device or accompanied by an adult for the 24 hours.  You may use ice on the affected area for the first 24 hours.  Put ice in a Ziploc bag and cover with a towel and place against area 15 minutes  on 15 minutes off.  You may switch to heat after 24 hours.

## 2015-09-14 ENCOUNTER — Telehealth: Payer: Self-pay | Admitting: *Deleted

## 2015-09-14 NOTE — Telephone Encounter (Signed)
Left voice mail

## 2015-10-12 ENCOUNTER — Ambulatory Visit: Payer: BC Managed Care – PPO | Admitting: Pain Medicine

## 2016-08-21 ENCOUNTER — Encounter: Payer: Self-pay | Admitting: *Deleted

## 2016-09-05 ENCOUNTER — Ambulatory Visit: Payer: Self-pay | Admitting: General Surgery

## 2016-09-07 ENCOUNTER — Ambulatory Visit: Payer: Self-pay | Admitting: General Surgery

## 2016-09-25 ENCOUNTER — Encounter: Payer: Self-pay | Admitting: *Deleted

## 2016-09-28 ENCOUNTER — Ambulatory Visit (INDEPENDENT_AMBULATORY_CARE_PROVIDER_SITE_OTHER): Payer: BC Managed Care – PPO | Admitting: General Surgery

## 2016-09-28 ENCOUNTER — Encounter: Payer: Self-pay | Admitting: General Surgery

## 2016-09-28 VITALS — BP 122/88 | HR 72 | Resp 12 | Ht 69.0 in | Wt 221.0 lb

## 2016-09-28 DIAGNOSIS — D213 Benign neoplasm of connective and other soft tissue of thorax: Secondary | ICD-10-CM | POA: Diagnosis not present

## 2016-09-28 DIAGNOSIS — M7989 Other specified soft tissue disorders: Secondary | ICD-10-CM

## 2016-09-28 NOTE — Progress Notes (Signed)
Patient ID: Marvin Le, male   DOB: 1975/08/16, 41 y.o.   MRN: [id number]  Chief Complaint  Patient presents with  . Lipoma    HPI Marvin Le is a 41 y.o. male.  Here for evaluation of a lipoma on his lower back. He states it has been there 13 years. He denies weight loss. The patient is a Music therapist. He is here today with his significant other, Marvin Le who was in the first graduating class of the [employer] PA program.  HPI  Past Medical History:  Diagnosis Date  . DDD (degenerative disc disease), lumbar 07/15/2015  . Heart murmur   . Lumbar radiculopathy 07/15/2015    Past Surgical History:  Procedure Laterality Date  . OTHER SURGICAL HISTORY     gastroc  . TONSILECTOMY/ADENOIDECTOMY WITH MYRINGOTOMY      Family History  Problem Relation Age of Onset  . Hyperlipidemia Mother   . Cancer Father   . Hyperlipidemia Father     Social History Social History  Substance Use Topics  . Smoking status: Former Smoker    Years: 20.00    Quit date: 04/03/2014  . Smokeless tobacco: Never Used     Comment: former smoker  . Alcohol use No    No Known Allergies  Current Outpatient Prescriptions  Medication Sig Dispense Refill  . ISOtretinoin (ACCUTANE) 40 MG capsule Take 40 mg by mouth daily.    . TraMADol HCl 50 MG TBDP Take 50 mg by mouth 3 (three) times daily.     Current Facility-Administered Medications  Medication Dose Route Frequency Provider Last Rate Last Dose  . bupivacaine (PF) (MARCAINE) 0.25 % injection 30 mL  30 mL Other Once Mohammed Kindle, MD      . lactated ringers infusion 1,000 mL  1,000 mL Intravenous Continuous Mohammed Kindle, MD      . orphenadrine (NORFLEX) injection 60 mg  60 mg Intramuscular Once Mohammed Kindle, MD      . triamcinolone acetonide (KENALOG-40) injection 40 mg  40 mg Other Once Mohammed Kindle, MD        Review of Systems Review of Systems  Constitutional: Negative.   Respiratory: Negative.   Cardiovascular: Negative.      Blood pressure 122/88, pulse 72, resp. rate 12, height 5\' 9"  (1.753 m), weight 221 lb (100.2 kg).  Physical Exam Physical Exam  Constitutional: He is oriented to person, place, and time. He appears well-developed and well-nourished.  Neurological: He is alert and oriented to person, place, and time.  Skin: Skin is warm and dry.  3 x 4 cm soft tissue mass at left L1  Psychiatric: His behavior is normal.     Assessment    Enlarging lipoma left lower back.    Plan    The patient was amenable to proceed with excision. ChloraPrep was applied to the skin followed by 20 mL of 0.5% Xylocaine with 0.25% Marcaine with 1 200,000 epinephrine. This was supplemented with 2 mL 1% plain Xylocaine. ChloraPrep was applied to the skin once again. Through a transverse incision the skin was excised to expose the lipoma. This extended down to but did not invade the underlying muscle fascia. The deep tissue was approximated with interrupted 3-0 Vicryl simple sutures. The skin was closed with a running 3-0 Vicryl subcuticular suture. Benzoin, Steri-Strips followed by Telfa and Tegaderm dressing was applied.  Postoperative wound care was reviewed.  Ice pack provided.    Call with results Follow up as needed  HPI,  Physical Exam, Assessment and Plan have been scribed under the direction and in the presence of Robert Bellow, MD. Karie Fetch, RN  I have completed the exam and reviewed the above documentation for accuracy and completeness.  I agree with the above.  Haematologist has been used and any errors in dictation or transcription are unintentional.  Hervey Ard, M.D., F.A.C.S.  Evelio, Rueda 09/29/2016, 8:21 PM

## 2016-09-28 NOTE — Patient Instructions (Addendum)
The patient is aware to call back for any questions or concerns. May shower May remove dressing in 2-3 days Steri strips will gradually come off over 2-3 weeks May use an Ice pack as needed for comfort  

## 2016-09-29 DIAGNOSIS — M7989 Other specified soft tissue disorders: Secondary | ICD-10-CM | POA: Insufficient documentation

## 2016-11-13 ENCOUNTER — Encounter: Payer: Self-pay | Admitting: General Surgery

## 2016-11-13 ENCOUNTER — Ambulatory Visit (INDEPENDENT_AMBULATORY_CARE_PROVIDER_SITE_OTHER): Payer: BC Managed Care – PPO | Admitting: General Surgery

## 2016-11-13 VITALS — BP 120/78 | HR 80 | Resp 12 | Ht 68.0 in | Wt 223.0 lb

## 2016-11-13 DIAGNOSIS — D171 Benign lipomatous neoplasm of skin and subcutaneous tissue of trunk: Secondary | ICD-10-CM

## 2016-11-13 NOTE — Progress Notes (Signed)
Patient ID: Marvin Le, male   DOB: [birth year], 41 y.o.   MRN: [id number]  Chief Complaint  Patient presents with  . Lipoma    HPI Marvin Le is a 41 y.o. male Here for a   evaluation of a lipoma on his right lower back. He states it has been there 2 years. He denies weight loss and no pain.  HPI  Past Medical History:  Diagnosis Date  . DDD (degenerative disc disease), lumbar 07/15/2015  . Heart murmur   . Lumbar radiculopathy 07/15/2015    Past Surgical History:  Procedure Laterality Date  . OTHER SURGICAL HISTORY     gastroc  . TONSILECTOMY/ADENOIDECTOMY WITH MYRINGOTOMY      Family History  Problem Relation Age of Onset  . Hyperlipidemia Mother   . Cancer Father   . Hyperlipidemia Father     Social History Social History  Substance Use Topics  . Smoking status: Former Smoker    Years: 20.00    Quit date: 04/03/2014  . Smokeless tobacco: Never Used     Comment: former smoker  . Alcohol use No    No Known Allergies  Current Outpatient Prescriptions  Medication Sig Dispense Refill  . ISOtretinoin (ACCUTANE) 40 MG capsule Take 40 mg by mouth daily.    . TraMADol HCl 50 MG TBDP Take 50 mg by mouth 3 (three) times daily.     Current Facility-Administered Medications  Medication Dose Route Frequency Provider Last Rate Last Dose  . bupivacaine (PF) (MARCAINE) 0.25 % injection 30 mL  30 mL Other Once Mohammed Kindle, MD      . lactated ringers infusion 1,000 mL  1,000 mL Intravenous Continuous Mohammed Kindle, MD      . orphenadrine (NORFLEX) injection 60 mg  60 mg Intramuscular Once Mohammed Kindle, MD      . triamcinolone acetonide (KENALOG-40) injection 40 mg  40 mg Other Once Mohammed Kindle, MD        Review of Systems Review of Systems  Constitutional: Negative.   Respiratory: Negative.   Cardiovascular: Negative.     Blood pressure 120/78, pulse 80, resp. rate 12, height 5\' 8"  (1.727 m), weight 223 lb (101.2 kg).  Physical Exam Physical Exam    Constitutional: He is oriented to person, place, and time. He appears well-developed and well-nourished.  Neurological: He is alert and oriented to person, place, and time.  Skin: Skin is warm and dry.       Data Reviewed Left lower back lipoma excised June 2018: Benign.  Assessment    Lipoma, asymptomatic.    Plan    Indications for excision reviewed: 1) enlargement; 2) pain or 3) oriented part of family members regarding other pathologies.    Patient to return as needed. The patient is aware to call back for any questions or concerns.   HPI, Physical Exam, Assessment and Plan have been scribed under the direction and in the presence of Hervey Ard, MD.  Gaspar Cola, CMA  I have completed the exam and reviewed the above documentation for accuracy and completeness.  I agree with the above.  Haematologist has been used and any errors in dictation or transcription are unintentional.  Hervey Ard, M.D., F.A.C.S.  Marvin Le, Marvin Le 11/14/2016, 6:51 AM

## 2016-11-13 NOTE — Patient Instructions (Signed)
Return as needed.The patient is aware to call back for any questions or concerns.  

## 2016-11-14 DIAGNOSIS — D171 Benign lipomatous neoplasm of skin and subcutaneous tissue of trunk: Secondary | ICD-10-CM | POA: Insufficient documentation

## 2017-02-20 DIAGNOSIS — Z8042 Family history of malignant neoplasm of prostate: Secondary | ICD-10-CM | POA: Insufficient documentation

## 2017-12-27 ENCOUNTER — Other Ambulatory Visit: Payer: Self-pay | Admitting: Internal Medicine

## 2017-12-27 DIAGNOSIS — M5440 Lumbago with sciatica, unspecified side: Principal | ICD-10-CM

## 2017-12-27 DIAGNOSIS — G8929 Other chronic pain: Secondary | ICD-10-CM

## 2018-01-11 ENCOUNTER — Ambulatory Visit
Admission: RE | Admit: 2018-01-11 | Discharge: 2018-01-11 | Disposition: A | Discharge status: Home / Self Care | Payer: BC Managed Care – PPO | Source: Ambulatory Visit | Attending: Internal Medicine | Admitting: Internal Medicine

## 2018-01-11 DIAGNOSIS — M5440 Lumbago with sciatica, unspecified side: Secondary | ICD-10-CM | POA: Diagnosis present

## 2018-01-11 DIAGNOSIS — G8929 Other chronic pain: Secondary | ICD-10-CM | POA: Diagnosis present

## 2018-01-11 DIAGNOSIS — M8938 Hypertrophy of bone, other site: Secondary | ICD-10-CM | POA: Insufficient documentation

## 2018-01-11 DIAGNOSIS — M5126 Other intervertebral disc displacement, lumbar region: Secondary | ICD-10-CM | POA: Diagnosis not present

## 2018-01-11 DIAGNOSIS — M48061 Spinal stenosis, lumbar region without neurogenic claudication: Secondary | ICD-10-CM | POA: Diagnosis not present

## 2018-01-18 DIAGNOSIS — E291 Testicular hypofunction: Secondary | ICD-10-CM | POA: Insufficient documentation

## 2018-02-19 ENCOUNTER — Other Ambulatory Visit: Payer: Self-pay | Admitting: Neurosurgery

## 2018-02-19 ENCOUNTER — Telehealth: Payer: Self-pay | Admitting: Nurse Practitioner

## 2018-02-19 DIAGNOSIS — G8929 Other chronic pain: Secondary | ICD-10-CM

## 2018-02-19 DIAGNOSIS — M545 Low back pain: Principal | ICD-10-CM

## 2018-02-19 NOTE — Telephone Encounter (Signed)
Phone call to patient to verify medication list and allergies for myelogram procedure. Pt instructed to hold tramadol for 48hrs prior to appointment time. Pt verbalized understanding.

## 2018-03-15 ENCOUNTER — Ambulatory Visit
Admission: RE | Admit: 2018-03-15 | Discharge: 2018-03-15 | Disposition: A | Discharge status: Home / Self Care | Payer: BC Managed Care – PPO | Source: Ambulatory Visit | Attending: Neurosurgery | Admitting: Neurosurgery

## 2018-03-15 ENCOUNTER — Telehealth: Payer: Self-pay

## 2018-03-15 DIAGNOSIS — M545 Low back pain, unspecified: Secondary | ICD-10-CM

## 2018-03-15 DIAGNOSIS — G8929 Other chronic pain: Secondary | ICD-10-CM

## 2018-03-15 MED ORDER — ONDANSETRON HCL 4 MG/2ML IJ SOLN: 4.0000 mg | Freq: Once | INTRAMUSCULAR | Status: DC

## 2018-03-15 MED ORDER — ONDANSETRON HCL 4 MG/2ML IJ SOLN: 4.0000 mg | Freq: Four times a day (QID) | INTRAMUSCULAR | Status: DC | PRN

## 2018-03-15 MED ORDER — MEPERIDINE HCL 100 MG/ML IJ SOLN: 75.0000 mg | Freq: Once | INTRAMUSCULAR | Status: DC

## 2018-03-15 MED ORDER — DIAZEPAM 5 MG PO TABS: 10.0000 mg | ORAL_TABLET | Freq: Once | ORAL | Status: DC

## 2018-03-15 MED ORDER — IOPAMIDOL (ISOVUE-M 200) INJECTION 41%: 15.0000 mL | Freq: Once | INTRAMUSCULAR | Status: DC

## 2018-03-15 MED ORDER — IOHEXOL 180 MG/ML  SOLN
15.0000 mL | Freq: Once | INTRAMUSCULAR | Status: AC | PRN
  Administered 2018-03-15: 15 mL via INTRATHECAL

## 2018-03-15 NOTE — Discharge Instructions (Signed)
Myelogram Discharge Instructions  1. Go home and rest quietly for the next 24 hours.  It is important to lie flat for the next 24 hours.  Get up only to go to the restroom.  You may lie in the bed or on a couch on your back, your stomach, your left side or your right side.  You may have one pillow under your head.  You may have pillows between your knees while you are on your side or under your knees while you are on your back.  2. DO NOT drive today.  Recline the seat as far back as it will go, while still wearing your seat belt, on the way home.  3. You may get up to go to the bathroom as needed.  You may sit up for 10 minutes to eat.  You may resume your normal diet and medications unless otherwise indicated.  Drink lots of extra fluids today and tomorrow.  4. The incidence of headache, nausea, or vomiting is about 5% (one in 20 patients).  If you develop a headache, lie flat and drink plenty of fluids until the headache goes away.  Caffeinated beverages may be helpful.  If you develop severe nausea and vomiting or a headache that does not go away with flat bed rest, call [phone].  5. You may resume normal activities after your 24 hours of bed rest is over; however, do not exert yourself strongly or do any heavy lifting tomorrow. If when you get up you have a headache when standing, go back to bed and force fluids for another 24 hours.  6. Call your physician for a follow-up appointment.  The results of your myelogram will be sent directly to your physician by the following day.  7. If you have any questions or if complications develop after you arrive home, please call [phone].  Discharge instructions have been explained to the patient.  The patient, or the person responsible for the patient, fully understands these instructions.  YOU MAY RESTART YOUR TRAMADOL AS NEEDED TOMORROW 03/16/2018 AT 09:30AM.

## 2018-03-15 NOTE — Telephone Encounter (Signed)
Diazepam 5mg , #4, no refills, phoned in to pharmacist at patient's [facility] in [place] ([facility] request out of the [facility]).  He is to take two of the 5mg  tabs at 1730 and the other two at 0130 03/16/18, per Dr. Jeralyn Ruths.  Brita Romp, RN

## 2019-07-28 ENCOUNTER — Other Ambulatory Visit: Payer: Self-pay | Admitting: Neurology

## 2019-07-28 DIAGNOSIS — G5793 Unspecified mononeuropathy of bilateral lower limbs: Secondary | ICD-10-CM

## 2019-08-09 ENCOUNTER — Ambulatory Visit: Payer: BC Managed Care – PPO

## 2019-08-12 ENCOUNTER — Other Ambulatory Visit (INDEPENDENT_AMBULATORY_CARE_PROVIDER_SITE_OTHER): Payer: Self-pay | Admitting: Neurology

## 2019-08-12 DIAGNOSIS — M79605 Pain in left leg: Secondary | ICD-10-CM

## 2019-08-12 DIAGNOSIS — R252 Cramp and spasm: Secondary | ICD-10-CM

## 2019-08-12 DIAGNOSIS — G5793 Unspecified mononeuropathy of bilateral lower limbs: Secondary | ICD-10-CM

## 2019-08-13 ENCOUNTER — Other Ambulatory Visit: Payer: Self-pay

## 2019-08-13 ENCOUNTER — Ambulatory Visit (INDEPENDENT_AMBULATORY_CARE_PROVIDER_SITE_OTHER): Payer: BC Managed Care – PPO

## 2019-08-13 DIAGNOSIS — G5793 Unspecified mononeuropathy of bilateral lower limbs: Secondary | ICD-10-CM

## 2019-08-13 DIAGNOSIS — R252 Cramp and spasm: Secondary | ICD-10-CM | POA: Diagnosis not present

## 2020-06-11 IMAGING — CT CT L SPINE W/ CM
1 of 7 series · 5 of 14 positions shown, 7 images · non-contrast
Comparison: Lumbar spine MRI 01/11/2018. Thoracic spine MRI
01/30/2015.

Addendum:
CLINICAL DATA: Bilateral lower extremity pain predominantly
involving the plantar aspects of the feet and posterior calves.
Milder pain in the lateral thighs and buttocks.
TECHNIQUE: Contiguous axial images were obtained through the Lumbar spine after
the intrathecal infusion of infusion. Coronal and sagittal
reconstructions were obtained of the axial image sets.

[Series 3: l spine soft · axial · 0.32mm/px · z∈[-356,-191]mm · 5 of 83 slices shown, 7 images]
[im 14/83  soft-tissue]
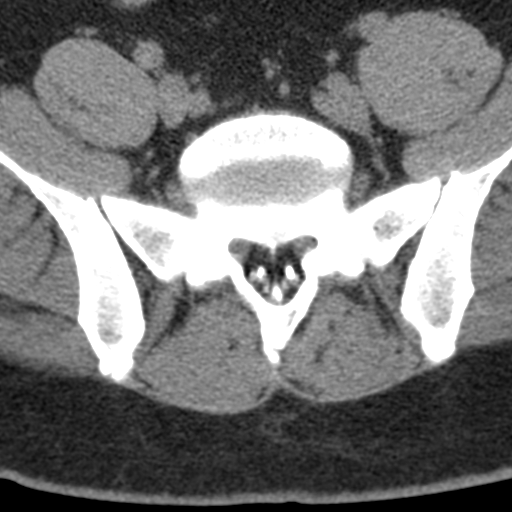
[im 14/83  bone]
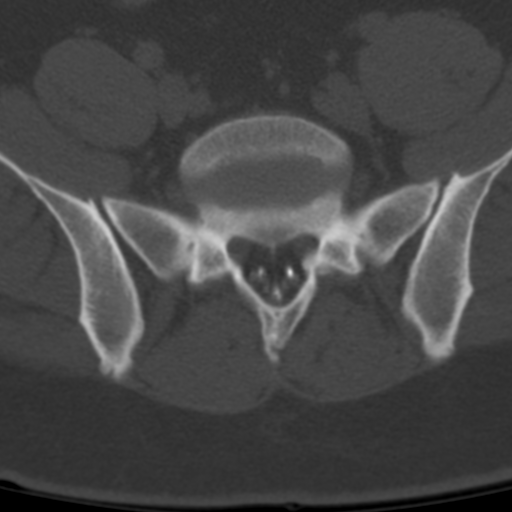
[im 28/83  bone]
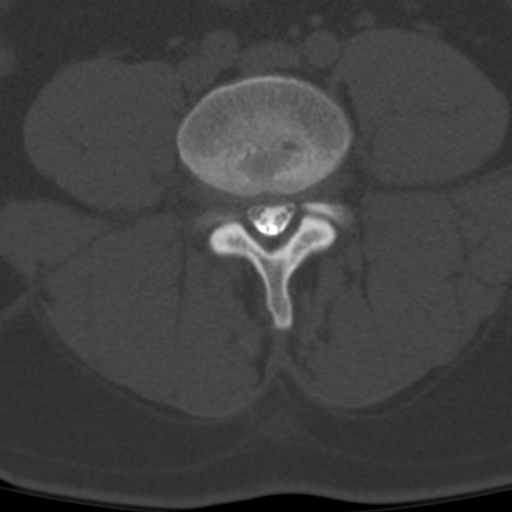
[im 42/83  bone]
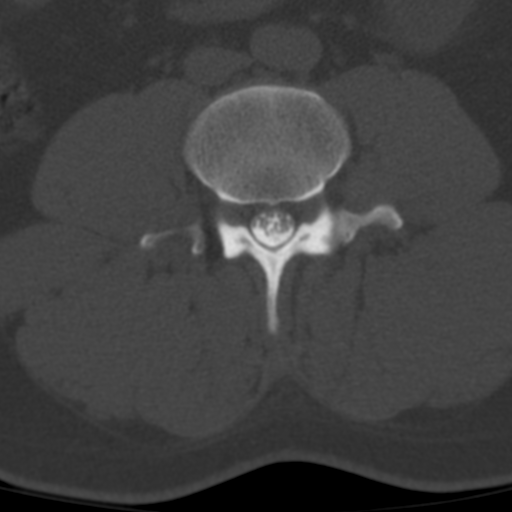
[im 55/83  bone]
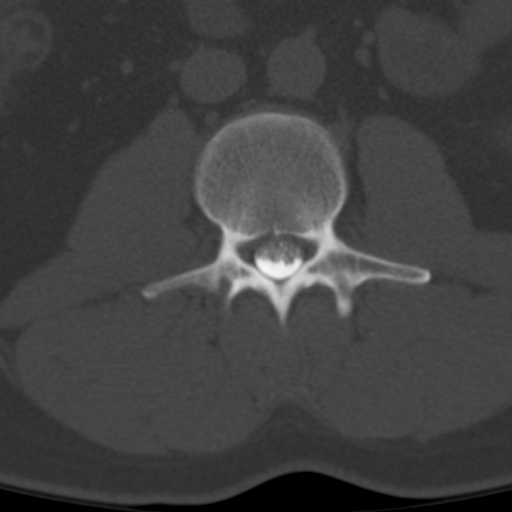
[im 69/83  soft-tissue]
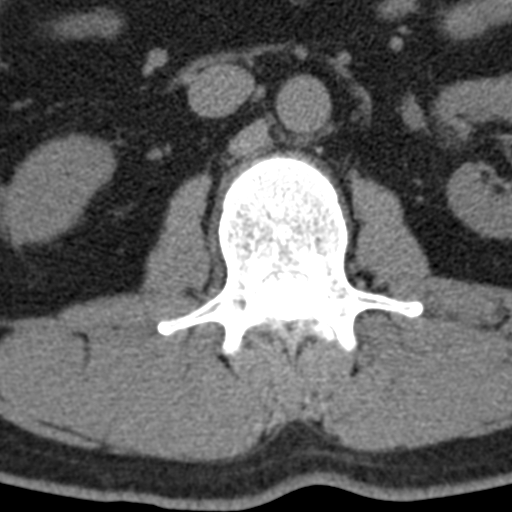
[im 69/83  bone]
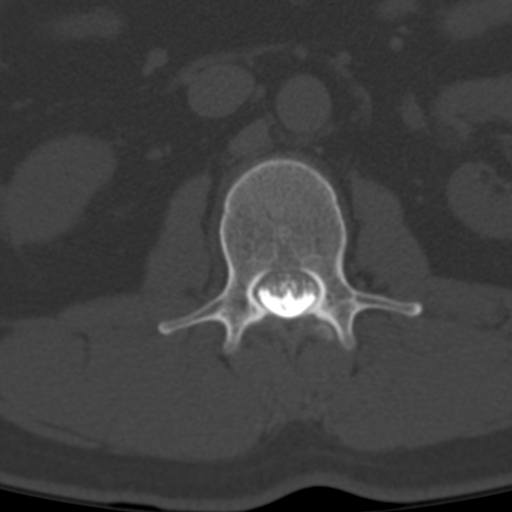

[5 of 14 positions shown; findings below may reference images not displayed]

EXAM:
LUMBAR MYELOGRAM

FLUOROSCOPY TIME:  Radiation Exposure Index (as provided by the
fluoroscopic device): 231.09 microGray*m^2

Fluoroscopy Time (in minutes and seconds):  15 seconds

PROCEDURE:
After thorough discussion of risks and benefits of the procedure
including bleeding, infection, injury to nerves, blood vessels,
adjacent structures as well as headache and CSF leak, written and
oral informed consent was obtained. Consent was obtained by Dr.
Payaso Coquito Decene. Time out form was completed.

Patient was positioned prone on the fluoroscopy table. Local
anesthesia was provided with 1% lidocaine without epinephrine after
prepped and draped in the usual sterile fashion. Puncture was
performed at L3-4 using a 3 1/2 inch 22-gauge spinal needle via a
right interlaminar approach. Using a single pass through the dura,
the needle was placed within the thecal sac, with return of clear
CSF. 15 mL of Omnipaque 180 was injected into the thecal sac, with
normal opacification of the nerve roots and cauda equina consistent
with free flow within the subarachnoid space.

I personally performed the lumbar puncture and administered the
intrathecal contrast. I also personally supervised acquisition of
the myelogram images.
FINDINGS: LUMBAR MYELOGRAM FINDINGS:

There are 5 non rib-bearing lumbar type vertebrae. The ribs at T12
are small. Vertebral alignment is within normal limits. No abnormal
motion is identified on flexion or extension radiographs. There are
shallow ventral extradural defects at L3-4 and L4-5 without evidence
of significant stenosis or nerve root sleeve cut off.

CT LUMBAR MYELOGRAM FINDINGS:

There is at most trace retrolisthesis of L2 on L3 and L3 on L4,
unchanged. No fracture or suspicious osseous lesion is identified.
The pedicles are mildly short on a congenital basis diffusely.

The conus medullaris terminates at L1. A single tortuous vessel is
noted in the dorsal aspect of the thecal sac at the thoracolumbar
junction without other definite abnormal vessels seen along the
distal cord on today's study or the prior MRIs to suggest an AV
fistula or other vascular abnormality. The cauda equina is
unremarkable aside from incidental partially conjoined right L5 and
S1 nerve roots. The paraspinal soft tissues are unremarkable.

T12-L1 and L1-2: Negative.

L2-3: Minimal disc bulging without significant stenosis, unchanged.

L3-4: Mild disc bulging, congenitally short pedicles, and mild facet
hypertrophy result in mild-to-moderate bilateral neural foraminal
stenosis without spinal stenosis, unchanged.

L4-5: Mild disc bulging, a shallow central disc protrusion,
congenitally short pedicles, and mild facet hypertrophy result in
mild left greater than right neural foraminal stenosis, unchanged.
There is borderline left lateral recess stenosis, less than what was
suggested by the appearance on the prior MRI. No spinal stenosis.

L5-S1: Mild facet arthrosis without disc herniation or stenosis,
unchanged.
IMPRESSION: 1. Mild lumbar spondylosis without spinal stenosis.
2. Mild-to-moderate bilateral neural foraminal stenosis at L3-4.
3. Mild bilateral neural foraminal stenosis at L4-5.

ADDENDUM:
The patient called at approximately 8 a.m. 03/16/2018, 22 hours
after the procedure, with concerns of low back pain. No reported
weakness. No headache. Patient was reassured that symptoms would
Gordon. Patient was instructed to take Tylenol or Aleve for low back

*** End of Addendum ***

## 2022-01-30 ENCOUNTER — Encounter (INDEPENDENT_AMBULATORY_CARE_PROVIDER_SITE_OTHER): Payer: Self-pay

## 2022-12-21 ENCOUNTER — Telehealth: Payer: Self-pay

## 2022-12-21 NOTE — Telephone Encounter (Signed)
Cancel per pt for now will call back when he wants to r/s.

## 2022-12-21 NOTE — Telephone Encounter (Signed)
Left message on 9-12,13.  Pt mom call on 12-18-22 to let me know he would call me as soon as he could. Due to pt work scheud[place] it hard to call during the day.  I sent pt email on 9-19

## 2023-01-03 ENCOUNTER — Ambulatory Visit: Payer: BC Managed Care – PPO | Admitting: Gastroenterology
